# Patient Record
Sex: Male | Born: 1995 | Race: White | Hispanic: No | Marital: Single | Smoking: Never smoker
Health system: Southern US, Community
[De-identification: ages and names within clinical notes are randomized; demographics above are authoritative.]

## PROBLEM LIST (undated history)

## (undated) DIAGNOSIS — S62309A Unspecified fracture of unspecified metacarpal bone, initial encounter for closed fracture: Secondary | ICD-10-CM

## (undated) DIAGNOSIS — J45909 Unspecified asthma, uncomplicated: Secondary | ICD-10-CM

## (undated) DIAGNOSIS — R011 Cardiac murmur, unspecified: Secondary | ICD-10-CM

## (undated) HISTORY — PX: WISDOM TOOTH EXTRACTION: SHX21

---
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
## 2015-07-28 ENCOUNTER — Encounter (HOSPITAL_BASED_OUTPATIENT_CLINIC_OR_DEPARTMENT_OTHER): Payer: Self-pay | Admitting: *Deleted

## 2015-07-28 ENCOUNTER — Emergency Department (HOSPITAL_BASED_OUTPATIENT_CLINIC_OR_DEPARTMENT_OTHER)
Admission: EM | Admit: 2015-07-28 | Discharge: 2015-07-28 | Disposition: A | Discharge status: Home / Self Care | Payer: 59 | Attending: Emergency Medicine | Admitting: Emergency Medicine

## 2015-07-28 DIAGNOSIS — R011 Cardiac murmur, unspecified: Secondary | ICD-10-CM | POA: Diagnosis not present

## 2015-07-28 DIAGNOSIS — J45909 Unspecified asthma, uncomplicated: Secondary | ICD-10-CM | POA: Diagnosis not present

## 2015-07-28 DIAGNOSIS — Z79899 Other long term (current) drug therapy: Secondary | ICD-10-CM | POA: Insufficient documentation

## 2015-07-28 DIAGNOSIS — L600 Ingrowing nail: Secondary | ICD-10-CM | POA: Diagnosis not present

## 2015-07-28 DIAGNOSIS — Z8781 Personal history of (healed) traumatic fracture: Secondary | ICD-10-CM | POA: Diagnosis not present

## 2015-07-28 DIAGNOSIS — Z791 Long term (current) use of non-steroidal anti-inflammatories (NSAID): Secondary | ICD-10-CM | POA: Insufficient documentation

## 2015-07-28 DIAGNOSIS — M79675 Pain in left toe(s): Secondary | ICD-10-CM | POA: Diagnosis present

## 2015-07-28 MED ORDER — LIDOCAINE HCL 2 % IJ SOLN
10.0000 mL | Freq: Once | INTRAMUSCULAR | Status: AC
  Administered 2015-07-28: 10 mg
  Filled 2015-07-28: qty 20

## 2015-07-28 MED ORDER — CEPHALEXIN 500 MG PO CAPS: 500.0000 mg | ORAL_CAPSULE | Freq: Four times a day (QID) | ORAL | Status: DC

## 2015-07-28 NOTE — ED Provider Notes (Signed)
CSN: [id number]     Arrival date & time 07/28/15  0137 History   First MD Initiated Contact with Patient 07/28/15 [phone]     Chief Complaint  Patient presents with  . Toe Pain     (Consider location/radiation/quality/duration/timing/severity/associated sxs/prior Treatment) HPI Comments: Patient is a 20 year old male who presents with complaints of left great toe pain and yesterday. He denies any injury or trauma. He is concerned he has a ingrown toenail.  Patient is a 20 y.o. male presenting with toe pain. The history is provided by the patient.  Toe Pain This is a new problem. The current episode started yesterday. The problem occurs constantly. The problem has been rapidly worsening. The symptoms are aggravated by walking. Nothing relieves the symptoms. He has tried nothing for the symptoms.    Past Medical History  Diagnosis Date  . Fracture of metacarpal 05/2012    right small finger  . Asthma     sports-induced; prn inhaler  . Heart murmur     states is very small, has never had any problems   Past Surgical History  Procedure Laterality Date  . Wisdom tooth extraction    . Open reduction internal fixation (orif) metacarpal  06/06/2012    Procedure: OPEN REDUCTION INTERNAL FIXATION (ORIF) METACARPAL;  Surgeon: Nicki Reaper, MD;  Location: [facility];  Service: Orthopedics;  Laterality: Right;  open reduction internal fixation right 5th finger    History reviewed. No pertinent family history. Social History  Substance Use Topics  . Smoking status: Never Smoker   . Smokeless tobacco: Never Used  . Alcohol Use: No    Review of Systems  All other systems reviewed and are negative.     Allergies  Review of patient's allergies indicates no known allergies.  Home Medications   Prior to Admission medications   Medication Sig Start Date End Date Taking? Authorizing Provider  albuterol (PROVENTIL HFA;VENTOLIN HFA) 108 (90 BASE) MCG/ACT inhaler Inhale 2 puffs  into the lungs every 6 (six) hours as needed.    Historical Provider, MD  HYDROcodone-acetaminophen (NORCO) 5-325 MG per tablet Take 1 tablet by mouth every 6 (six) hours as needed for pain. 06/06/12   Cindee Salt, MD  naproxen sodium (ANAPROX) 220 MG tablet Take 220 mg by mouth 2 (two) times daily with a meal.    Historical Provider, MD   BP 134/79 mmHg  Pulse 84  Temp(Src) 98.7 F (37.1 C) (Oral)  Resp 18  Ht  (1.727 m)  Wt 130 lb (58.968 kg)  BMI 19.77 kg/m2  SpO2 99% Physical Exam  Constitutional: He is oriented to person, place, and time. He appears well-developed and well-nourished. No distress.  HENT:  Head: Normocephalic and atraumatic.  Neck: Normal range of motion. Neck supple.  Neurological: He is alert and oriented to person, place, and time.  Skin: Skin is warm and dry. He is not diaphoretic.  The left great toe has what appears to be an ingrown toenail. There is significant surrounding erythema and swelling. It is tender to the touch.  Nursing note and vitals reviewed.   ED Course  Procedures (including critical care time) Labs Review Labs Reviewed - No data to display  Imaging Review No results found. I have personally reviewed and evaluated these images and lab results as part of my medical decision-making.   EKG Interpretation None      MDM   Final diagnoses:  None    The toenail was anesthetized with  a digital block using 2% lidocaine. The toe was prepped with Betadine and a small section of nail was removed. There was a release of a significant quantity of yellow-green purulent material. He will be treated with Keflex, warm soaks, and when necessary follow-up.    Geoffery Lyons, MD 07/28/15 [phone]

## 2015-07-28 NOTE — Discharge Instructions (Signed)
Soak your toe as frequently as possible for the next 2-3 days.  Keflex as prescribed.  Return to the ER if symptoms significantly worsen or change.   Ingrown Toenail An ingrown toenail occurs when the corner or sides of your toenail grow into the surrounding skin. The big toe is most commonly affected, but it can happen to any of your toes. If your ingrown toenail is not treated, you will be at risk for infection. CAUSES This condition may be caused by:  Wearing shoes that are too small or tight.  Injury or trauma, such as stubbing your toe or having your toe stepped on.  Improper cutting or care of your toenails.  Being born with (congenital) nail or foot abnormalities, such as having a nail that is too big for your toe. RISK FACTORS Risk factors for an ingrown toenail include:  Age. Your nails tend to thicken as you get older, so ingrown nails are more common in older people.  Diabetes.  Cutting your toenails incorrectly.  Blood circulation problems. SYMPTOMS Symptoms may include:  Pain, soreness, or tenderness.  Redness.  Swelling.  Hardening of the skin surrounding the toe. Your ingrown toenail may be infected if there is fluid, pus, or drainage. DIAGNOSIS  An ingrown toenail may be diagnosed by medical history and physical exam. If your toenail is infected, your health care provider may test a sample of the drainage. TREATMENT Treatment depends on the severity of your ingrown toenail. Some ingrown toenails may be treated at home. More severe or infected ingrown toenails may require surgery to remove all or part of the nail. Infected ingrown toenails may also be treated with antibiotic medicines. HOME CARE INSTRUCTIONS  If you were prescribed an antibiotic medicine, finish all of it even if you start to feel better.  Soak your foot in warm soapy water for 20 minutes, 3 times per day or as directed by your health care provider.  Carefully lift the edge of the nail  away from the sore skin by wedging a small piece of cotton under the corner of the nail. This may help with the pain. Be careful not to cause more injury to the area.  Wear shoes that fit well. If your ingrown toenail is causing you pain, try wearing sandals, if possible.  Trim your toenails regularly and carefully. Do not cut them in a curved shape. Cut your toenails straight across. This prevents injury to the skin at the corners of the toenail.  Keep your feet clean and dry.  If you are having trouble walking and are given crutches by your health care provider, use them as directed.  Do not pick at your toenail or try to remove it yourself.  Take medicines only as directed by your health care provider.  Keep all follow-up visits as directed by your health care provider. This is important. SEEK MEDICAL CARE IF:  Your symptoms do not improve with treatment. SEEK IMMEDIATE MEDICAL CARE IF:  You have red streaks that start at your foot and go up your leg.  You have a fever.  You have increased redness, swelling, or pain.  You have fluid, blood, or pus coming from your toenail.   This information is not intended to replace advice given to you by your health care provider. Make sure you discuss any questions you have with your health care provider.   Document Released: 06/08/2000 Document Revised: 10/26/2014 Document Reviewed: 05/05/2014 Elsevier Interactive Patient Education [employer]

## 2015-07-28 NOTE — ED Notes (Signed)
C/o pain to left great toe on set yesterday,,  States saw green drainage,  Toe is red ,  Wears steel toe shoes

## 2018-11-12 ENCOUNTER — Ambulatory Visit (INDEPENDENT_AMBULATORY_CARE_PROVIDER_SITE_OTHER): Payer: No Typology Code available for payment source

## 2018-11-12 ENCOUNTER — Other Ambulatory Visit: Payer: Self-pay

## 2018-11-12 ENCOUNTER — Ambulatory Visit (HOSPITAL_COMMUNITY)
Admission: EM | Admit: 2018-11-12 | Discharge: 2018-11-12 | Disposition: A | Discharge status: Home / Self Care | Payer: No Typology Code available for payment source | Attending: Family Medicine | Admitting: Family Medicine

## 2018-11-12 ENCOUNTER — Encounter (HOSPITAL_COMMUNITY): Payer: Self-pay | Admitting: Emergency Medicine

## 2018-11-12 DIAGNOSIS — S62325A Displaced fracture of shaft of fourth metacarpal bone, left hand, initial encounter for closed fracture: Secondary | ICD-10-CM

## 2018-11-12 NOTE — Discharge Instructions (Signed)
Please rest, ice and elevate the affected extremity. If not allergic, you may take Motrin 600mg every 8 hours or Tylenol 1000mg every 6 hours or as needed for discomfort. Follow up with orthopaedic surgery within one week for further evaluation. Please call for any appointment. Do not remove your splint. You may use a garbage bag while showering to keep your splint dry. Please return here if you are experiencing increased pain, tingling/numbness, swelling, redness, or fever. °

## 2018-11-12 NOTE — ED Provider Notes (Signed)
[facility] CARE CENTER   [id number] 11/12/18 Arrival Time: 1349  ASSESSMENT & PLAN:  1. Closed displaced fracture of shaft of fourth metacarpal bone of left hand, initial encounter    I have personally viewed the imaging studies ordered this visit. Mildly displaced oblique fracture of fourth metacarpal.  Follow-up Information    Schedule an appointment as soon as possible for a visit  with Dominica Severin, MD.   Specialty:  Orthopedic Surgery Contact information: [address] [place] [address] [phone]          Ulnar gutter splint applied by orthopaedic tech. Sling fitted.   Discharge Instructions     Please rest, ice and elevate the affected extremity. If not allergic, you may take Motrin 600mg  every 8 hours or Tylenol 1000mg  every 6 hours or as needed for discomfort. Follow up with orthopaedic surgery within one week for further evaluation. Please call for any appointment. Do not remove your splint. You may use a garbage bag while showering to keep your splint dry. Please return here if you are experiencing increased pain, tingling/numbness, swelling, redness, or fever.      Reviewed expectations re: course of current medical issues. Questions answered. Outlined signs and symptoms indicating need for more acute intervention. Patient verbalized understanding. After Visit Summary given.  SUBJECTIVE: History from: patient. Troy Howell is a left-handed 23 y.o. male who reports persistent moderate pain of his left dorsal hand; described as aching without radiation. Onset: abrupt, today. Injury/trama: yes, reports abruptly hyperextending fingers of left hand while working on car; immediate discomfort. Symptoms have progressed to a point and plateaued since beginning. Aggravating factors: movement. Alleviating factors: holding hand still. Associated symptoms: none reported. Extremity sensation changes or weakness: none. Self treatment: has not  tried OTCs for relief of pain. History of similar: no.  Past Surgical History:  Procedure Laterality Date  . OPEN REDUCTION INTERNAL FIXATION (ORIF) METACARPAL  06/06/2012   Procedure: OPEN REDUCTION INTERNAL FIXATION (ORIF) METACARPAL;  Surgeon: Nicki Reaper, MD;  Location: [facility];  Service: Orthopedics;  Laterality: Right;  open reduction internal fixation right 5th finger   . WISDOM TOOTH EXTRACTION      ROS: As per HPI. All other systems negative.   OBJECTIVE:  Vitals:   11/12/18 1412  BP: (!) 141/85  Pulse: 85  Resp: 18  Temp: 98.8 F (37.1 C)  TempSrc: Oral  SpO2: 97%    General appearance: alert; no distress HEENT: [place]; AT Neck: supple with FROM Extremities: . LUE: warm and well perfused; fairly well localized moderate tenderness over left dorsal hand around mid to distal 2/3 metacarpals; with gross deformities; with mild swelling; with no bruising; finger/wrist ROM: normal with reported discomfort CV: brisk extremity capillary refill of LUE; 2+ radial pulse of LUE. Skin: warm and dry; no visible rashes Neurologic: gait normal; normal reflexes of LUE; normal sensation of LUE; normal strength of LUE Psychological: alert and cooperative; normal mood and affect  Imaging: Dg Hand Complete Left  Result Date: 11/12/2018 CLINICAL DATA:  Left hand pain EXAM: LEFT HAND - COMPLETE 3+ VIEW COMPARISON:  None. FINDINGS: Minimally displaced oblique fracture of the fourth metacarpal. Moderate soft tissue swelling. No dislocation. No other fracture. IMPRESSION: Minimally displaced oblique fracture of the left fourth metacarpal. Electronically Signed   By: Deatra Robinson M.D.   On: 11/12/2018 14:41    No Known Allergies  Past Medical History:  Diagnosis Date  . Asthma    sports-induced; prn  inhaler  . Fracture of metacarpal 05/2012   right small finger  . Heart murmur    states is very small, has never had any problems   Social History   Socioeconomic  History  . Marital status: Single    Spouse name: Not on file  . Number of children: Not on file  . Years of education: Not on file  . Highest education level: Not on file  Occupational History  . Not on file  Social Needs  . Financial resource strain: Not on file  . Food insecurity:    Worry: Not on file    Inability: Not on file  . Transportation needs:    Medical: Not on file    Non-medical: Not on file  Tobacco Use  . Smoking status: Never Smoker  . Smokeless tobacco: Never Used  Substance and Sexual Activity  . Alcohol use: No  . Drug use: No  . Sexual activity: Not on file  Lifestyle  . Physical activity:    Days per week: Not on file    Minutes per session: Not on file  . Stress: Not on file  Relationships  . Social connections:    Talks on phone: Not on file    Gets together: Not on file    Attends religious service: Not on file    Active member of club or organization: Not on file    Attends meetings of clubs or organizations: Not on file    Relationship status: Not on file  Other Topics Concern  . Not on file  Social History Narrative  . Not on file   Family History  Problem Relation Age of Onset  . Healthy Mother    Past Surgical History:  Procedure Laterality Date  . OPEN REDUCTION INTERNAL FIXATION (ORIF) METACARPAL  06/06/2012   Procedure: OPEN REDUCTION INTERNAL FIXATION (ORIF) METACARPAL;  Surgeon: Nicki ReaperGary R Kuzma, MD;  Location: [facility];  Service: Orthopedics;  Laterality: Right;  open reduction internal fixation right 5th finger   . WISDOM TOOTH EXTRACTION        Mardella LaymanHagler, Giana Castner, MD 11/12/18 [phone]

## 2018-11-12 NOTE — ED Triage Notes (Signed)
Pt sts left hand pain after injuring while working on a car

## 2018-11-12 NOTE — Progress Notes (Signed)
Orthopedic Tech Progress Note Patient Details:  Troy Howell 10-24-1995 [id number] The RN asked me to bring a sling from [facility]ICE because they did not have any Large slings there. Ortho Devices Type of Ortho Device: Arm sling, Ulna gutter splint Ortho Device/Splint Location: ULE Ortho Device/Splint Interventions: Adjustment, Application, Ordered   Post Interventions Patient Tolerated: Well Instructions Provided: Care of device, Adjustment of device   [name] 11/12/2018, 3:32 PM

## 2018-11-19 ENCOUNTER — Encounter (HOSPITAL_BASED_OUTPATIENT_CLINIC_OR_DEPARTMENT_OTHER): Payer: Self-pay | Admitting: *Deleted

## 2018-11-19 ENCOUNTER — Other Ambulatory Visit: Payer: Self-pay | Admitting: Orthopedic Surgery

## 2018-11-19 ENCOUNTER — Other Ambulatory Visit (HOSPITAL_COMMUNITY)
Admission: RE | Admit: 2018-11-19 | Discharge: 2018-11-19 | Disposition: A | Discharge status: Home / Self Care | Payer: No Typology Code available for payment source | Source: Ambulatory Visit | Attending: Orthopedic Surgery | Admitting: Orthopedic Surgery

## 2018-11-19 ENCOUNTER — Other Ambulatory Visit: Payer: Self-pay

## 2018-11-19 DIAGNOSIS — Z1159 Encounter for screening for other viral diseases: Secondary | ICD-10-CM | POA: Insufficient documentation

## 2018-11-19 LAB — SARS CORONAVIRUS 2 BY RT PCR (HOSPITAL ORDER, PERFORMED IN ~~LOC~~ HOSPITAL LAB): SARS Coronavirus 2: NEGATIVE

## 2018-11-20 ENCOUNTER — Encounter (HOSPITAL_BASED_OUTPATIENT_CLINIC_OR_DEPARTMENT_OTHER): Payer: Self-pay | Admitting: *Deleted

## 2018-11-20 ENCOUNTER — Other Ambulatory Visit: Payer: Self-pay

## 2018-11-20 ENCOUNTER — Encounter (HOSPITAL_BASED_OUTPATIENT_CLINIC_OR_DEPARTMENT_OTHER)
Admission: RE | Disposition: A | Discharge status: Home / Self Care | Payer: Self-pay | Source: Home / Self Care | Attending: Orthopedic Surgery

## 2018-11-20 ENCOUNTER — Ambulatory Visit (HOSPITAL_BASED_OUTPATIENT_CLINIC_OR_DEPARTMENT_OTHER): Payer: No Typology Code available for payment source | Admitting: Anesthesiology

## 2018-11-20 ENCOUNTER — Ambulatory Visit (HOSPITAL_BASED_OUTPATIENT_CLINIC_OR_DEPARTMENT_OTHER)
Admission: RE | Admit: 2018-11-20 | Discharge: 2018-11-20 | Disposition: A | Discharge status: Home / Self Care | Payer: No Typology Code available for payment source | Attending: Orthopedic Surgery | Admitting: Orthopedic Surgery

## 2018-11-20 DIAGNOSIS — X58XXXA Exposure to other specified factors, initial encounter: Secondary | ICD-10-CM | POA: Diagnosis not present

## 2018-11-20 DIAGNOSIS — Z79899 Other long term (current) drug therapy: Secondary | ICD-10-CM | POA: Insufficient documentation

## 2018-11-20 DIAGNOSIS — Z7951 Long term (current) use of inhaled steroids: Secondary | ICD-10-CM | POA: Diagnosis not present

## 2018-11-20 DIAGNOSIS — S62325A Displaced fracture of shaft of fourth metacarpal bone, left hand, initial encounter for closed fracture: Secondary | ICD-10-CM | POA: Diagnosis not present

## 2018-11-20 DIAGNOSIS — J45909 Unspecified asthma, uncomplicated: Secondary | ICD-10-CM | POA: Diagnosis not present

## 2018-11-20 HISTORY — PX: OPEN REDUCTION INTERNAL FIXATION (ORIF) METACARPAL: SHX6234

## 2018-11-20 SURGERY — OPEN REDUCTION INTERNAL FIXATION (ORIF) METACARPAL
Anesthesia: Monitor Anesthesia Care | Laterality: Left

## 2018-11-20 MED ORDER — PROPOFOL 500 MG/50ML IV EMUL
INTRAVENOUS | Status: DC | PRN
  Administered 2018-11-20: 125 ug/kg/min via INTRAVENOUS

## 2018-11-20 MED ORDER — CEFAZOLIN SODIUM-DEXTROSE 2-4 GM/100ML-% IV SOLN
INTRAVENOUS | Status: AC
  Filled 2018-11-20: qty 100

## 2018-11-20 MED ORDER — MIDAZOLAM HCL 2 MG/2ML IJ SOLN
INTRAMUSCULAR | Status: AC
  Filled 2018-11-20: qty 2

## 2018-11-20 MED ORDER — ONDANSETRON HCL 4 MG/2ML IJ SOLN
INTRAMUSCULAR | Status: AC
  Filled 2018-11-20: qty 2

## 2018-11-20 MED ORDER — CHLORHEXIDINE GLUCONATE 4 % EX LIQD: 60.0000 mL | Freq: Once | CUTANEOUS | Status: DC

## 2018-11-20 MED ORDER — ROPIVACAINE HCL 5 MG/ML IJ SOLN
INTRAMUSCULAR | Status: DC | PRN
  Administered 2018-11-20: 30 mL via PERINEURAL

## 2018-11-20 MED ORDER — PROMETHAZINE HCL 25 MG/ML IJ SOLN: 6.2500 mg | INTRAMUSCULAR | Status: DC | PRN

## 2018-11-20 MED ORDER — LIDOCAINE 2% (20 MG/ML) 5 ML SYRINGE
INTRAMUSCULAR | Status: AC
  Filled 2018-11-20: qty 15

## 2018-11-20 MED ORDER — OXYCODONE HCL 5 MG/5ML PO SOLN: 5.0000 mg | Freq: Once | ORAL | Status: DC | PRN

## 2018-11-20 MED ORDER — LACTATED RINGERS IV SOLN
INTRAVENOUS | Status: DC
  Administered 2018-11-20 (×2): via INTRAVENOUS

## 2018-11-20 MED ORDER — CEFAZOLIN SODIUM-DEXTROSE 2-4 GM/100ML-% IV SOLN
2.0000 g | INTRAVENOUS | Status: AC
  Administered 2018-11-20: 2 g via INTRAVENOUS

## 2018-11-20 MED ORDER — OXYCODONE HCL 5 MG PO TABS: 5.0000 mg | ORAL_TABLET | Freq: Once | ORAL | Status: DC | PRN

## 2018-11-20 MED ORDER — FENTANYL CITRATE (PF) 100 MCG/2ML IJ SOLN
50.0000 ug | INTRAMUSCULAR | Status: DC | PRN
  Administered 2018-11-20: 100 ug via INTRAVENOUS

## 2018-11-20 MED ORDER — MIDAZOLAM HCL 2 MG/2ML IJ SOLN
1.0000 mg | INTRAMUSCULAR | Status: DC | PRN
  Administered 2018-11-20 (×2): 2 mg via INTRAVENOUS

## 2018-11-20 MED ORDER — HYDROMORPHONE HCL 1 MG/ML IJ SOLN: 0.2500 mg | INTRAMUSCULAR | Status: DC | PRN

## 2018-11-20 MED ORDER — KETOROLAC TROMETHAMINE 30 MG/ML IJ SOLN
INTRAMUSCULAR | Status: AC
  Filled 2018-11-20: qty 1

## 2018-11-20 MED ORDER — HYDROCODONE-ACETAMINOPHEN 5-325 MG PO TABS: 1.0000 | ORAL_TABLET | Freq: Four times a day (QID) | ORAL | 0 refills | Status: DC | PRN

## 2018-11-20 MED ORDER — SCOPOLAMINE 1 MG/3DAYS TD PT72: 1.0000 | MEDICATED_PATCH | Freq: Once | TRANSDERMAL | Status: DC | PRN

## 2018-11-20 MED ORDER — LIDOCAINE 2% (20 MG/ML) 5 ML SYRINGE
INTRAMUSCULAR | Status: DC | PRN
  Administered 2018-11-20: 50 mg via INTRAVENOUS

## 2018-11-20 MED ORDER — FENTANYL CITRATE (PF) 100 MCG/2ML IJ SOLN
INTRAMUSCULAR | Status: AC
  Filled 2018-11-20: qty 2

## 2018-11-20 SURGICAL SUPPLY — 51 items
APL PRP STRL LF DISP 70% ISPRP (MISCELLANEOUS) ×1
BIT DRILL 1.0 (BIT) ×2
BIT DRILL 1.0MM (BIT) ×1
BIT DRILL 1.0X50 (BIT) IMPLANT
BLADE MINI RND TIP GREEN BEAV (BLADE) ×3 IMPLANT
BLADE SURG 15 STRL LF DISP TIS (BLADE) ×1 IMPLANT
BLADE SURG 15 STRL SS (BLADE) ×3
BNDG CMPR 9X4 STRL LF SNTH (GAUZE/BANDAGES/DRESSINGS) ×1
BNDG COHESIVE 3X5 TAN STRL LF (GAUZE/BANDAGES/DRESSINGS) ×1 IMPLANT
BNDG ESMARK 4X9 LF (GAUZE/BANDAGES/DRESSINGS) ×3 IMPLANT
BNDG GAUZE ELAST 4 BULKY (GAUZE/BANDAGES/DRESSINGS) ×3 IMPLANT
CHLORAPREP W/TINT 26 (MISCELLANEOUS) ×3 IMPLANT
CORD BIPOLAR FORCEPS 12FT (ELECTRODE) ×3 IMPLANT
COVER BACK TABLE REUSABLE LG (DRAPES) ×3 IMPLANT
COVER MAYO STAND REUSABLE (DRAPES) ×3 IMPLANT
COVER WAND RF STERILE (DRAPES) IMPLANT
CUFF TOURN SGL QUICK 18X4 (TOURNIQUET CUFF) ×2 IMPLANT
DECANTER SPIKE VIAL GLASS SM (MISCELLANEOUS) IMPLANT
DRAPE EXTREMITY T 121X128X90 (DISPOSABLE) ×3 IMPLANT
DRAPE OEC MINIVIEW 54X84 (DRAPES) ×3 IMPLANT
DRAPE SURG 17X23 STRL (DRAPES) ×3 IMPLANT
GAUZE SPONGE 4X4 12PLY STRL (GAUZE/BANDAGES/DRESSINGS) ×3 IMPLANT
GAUZE XEROFORM 1X8 LF (GAUZE/BANDAGES/DRESSINGS) ×3 IMPLANT
GLOVE BIOGEL PI IND STRL 8.5 (GLOVE) ×1 IMPLANT
GLOVE BIOGEL PI INDICATOR 8.5 (GLOVE) ×2
GLOVE SURG ORTHO 8.0 STRL STRW (GLOVE) ×3 IMPLANT
GOWN STRL REUS W/ TWL LRG LVL3 (GOWN DISPOSABLE) IMPLANT
GOWN STRL REUS W/TWL LRG LVL3 (GOWN DISPOSABLE) ×3
GOWN STRL REUS W/TWL XL LVL3 (GOWN DISPOSABLE) ×6 IMPLANT
NDL PRECISIONGLIDE 27X1.5 (NEEDLE) IMPLANT
NEEDLE PRECISIONGLIDE 27X1.5 (NEEDLE) IMPLANT
NS IRRIG 1000ML POUR BTL (IV SOLUTION) ×3 IMPLANT
PACK BASIN DAY SURGERY FS (CUSTOM PROCEDURE TRAY) ×3 IMPLANT
PAD CAST 3X4 CTTN HI CHSV (CAST SUPPLIES) ×1 IMPLANT
PADDING CAST ABS 4INX4YD NS (CAST SUPPLIES) ×2
PADDING CAST ABS COTTON 4X4 ST (CAST SUPPLIES) ×1 IMPLANT
PADDING CAST COTTON 3X4 STRL (CAST SUPPLIES) ×3
SCREW SELF TAP CORTEX 1.3 7MM (Screw) ×4 IMPLANT
SCREW SELF TAP CORTEX 1.3 8MM (Screw) ×3 IMPLANT
SLEEVE SCD COMPRESS KNEE MED (MISCELLANEOUS) ×3 IMPLANT
SPLINT PLASTER CAST XFAST 3X15 (CAST SUPPLIES) IMPLANT
SPLINT PLASTER XTRA FASTSET 3X (CAST SUPPLIES) ×2
STOCKINETTE 4X48 STRL (DRAPES) ×3 IMPLANT
SUT CHROMIC 5 0 P 3 (SUTURE) ×2 IMPLANT
SUT ETHILON 4 0 PS 2 18 (SUTURE) ×3 IMPLANT
SUT MERSILENE 4 0 P 3 (SUTURE) IMPLANT
SUT VICRYL 4-0 PS2 18IN ABS (SUTURE) ×3 IMPLANT
SYR BULB 3OZ (MISCELLANEOUS) ×3 IMPLANT
SYR CONTROL 10ML LL (SYRINGE) IMPLANT
TOWEL GREEN STERILE FF (TOWEL DISPOSABLE) ×5 IMPLANT
UNDERPAD 30X30 (UNDERPADS AND DIAPERS) ×3 IMPLANT

## 2018-11-20 NOTE — Brief Op Note (Signed)
11/20/2018  2:04 PM  PATIENT:  Troy Howell  23 y.o. male  PRE-OPERATIVE DIAGNOSIS:  FRACTURE LEFT RING METACARPAL  POST-OPERATIVE DIAGNOSIS:  FRACTURE LEFT RING METACARPAL  PROCEDURE:  Procedure(s) with comments: OPEN REDUCTION INTERNAL FIXATION (ORIF)LEFT RING  METACARPAL (Left) - AUXILLARY  SURGEON:  Surgeon(s) and Role:    * Troy Salt, MD - Primary    * Troy Loa, MD - Assisting  PHYSICIAN ASSISTANT:   ASSISTANTS: Troy Layton Tappan,MD   ANESTHESIA:   regional and IV sedation  EBL: 62ml BLOOD ADMINISTERED:none  DRAINS: none   LOCAL MEDICATIONS USED:  NONE  SPECIMEN:  No Specimen  DISPOSITION OF SPECIMEN:  N/A  COUNTS:  YES  TOURNIQUET:   Total Tourniquet Time Documented: Upper Arm (Left) - 44 minutes Total: Upper Arm (Left) - 44 minutes   DICTATION: .Troy Howell Dictation  PLAN OF CARE: Discharge to home after PACU  PATIENT DISPOSITION:  PACU - hemodynamically stable.

## 2018-11-20 NOTE — Op Note (Signed)
NAME: Troy Howell MEDICAL RECORD NO: [id number] DATE OF BIRTH: 11/08/1995 [facility] LOCATION: [facility] PHYSICIAN: Nicki ReaperGARY R. Imelda Dandridge, MD   OPERATIVE REPORT   DATE OF PROCEDURE: 11/20/18    PREOPERATIVE DIAGNOSIS: Fracture ring finger metacarpal left hand   POSTOPERATIVE DIAGNOSIS:   Same   PROCEDURE:   Open reduction internal fixation metacarpal ring finger left hand   SURGEON: Cindee SaltGary Clemie General, M.D.   ASSISTANT: Betha LoaKevin Tierria Watson, MD   ANESTHESIA:  Regional with sedation   INTRAVENOUS FLUIDS:  Per anesthesia flow sheet.   ESTIMATED BLOOD LOSS:  Minimal.   COMPLICATIONS:  None.   SPECIMENS:  none   TOURNIQUET TIME:    Total Tourniquet Time Documented: Upper Arm (Left) - 44 minutes Total: Upper Arm (Left) - 44 minutes    DISPOSITION:  Stable to PACU.   INDICATIONS: Patient is a 23 year old male who sustained an injury to his left hand while working on his car he had x-rays taken of 1 week ago revealing a long oblique fracture of his ring finger metacarpal repeat x-rays revealed displacement.  He is admitted for open reduction internal fixation fourth metacarpal right hand.  Pre-peri-and postoperative course been discussed along with risks and complications.  He is aware there is no guarantee to the surgery the possibility of infection recurrence injury to arteries nerves tendons incomplete relief of symptoms and dystrophy.  He has undergone open reduction internal fixation of a similar fracture on his opposite opposite hand in the past.  In the preoperative area the patient seen the extremity marked by both patient and surgeon antibiotic given  OPERATIVE COURSE: Patient is brought to the operating room after supraclavicular block was carried out without difficulty in the preoperative area under the direction of the anesthesia department.  He was prepped using ChloraPrep in the supine position with the left arm free.  A timeout was taken to confirm patient  procedure.  A longitudinal incision was made on the dorsum of his hand and a an interval between his tattoo.  This carried down through subcutaneous tissue.  Bleeders were electrocauterized with bipolar.  Dorsal sensory ulnar nerve branches were identified protected.  Dissection was carried down to the radial aspect of the extensor to the ring finger.  The periosteum was incised.  Moderate musculature was present over the proximal aspect of the metacarpal of the.  The fracture fragment was identified this was opened and debrided with a house curette.  The fracture was then reduced clamped with a phalangeal clamp the x-rays taken AP lateral and obliques reveal the fracture and good position with flexion extension of his finger there was no rotatory deformity.  The 1.3 millimeters screws from the modular hand were used for fixation.  These were drilled with a 1.0 mm drill bit countersunk measured 8 7 and 7 mm screws were then inserted into the bone fixing the fracture in good position.  This was confirmed with AP lateral x-rays with flexion extension no rotation of the finger was noted.  Wound was copiously irrigated with saline.  The periosteum muscle was closed with a running 4-0 chromic suture.  The subcutaneous tissue was closed with interrupted 4-0.  The skin was closed realigning his tattoo with interrupted 4-0 nylon sutures.  A sterile compressive dressing dorsal palmar splint was applied.  Deflation of the tourniquet all fingers immediately pink.  He was taken to the recovery room for observation in satisfactory condition.  He will be discharged home to return the hand  center of [facility] in 1 week Tylenol ibuprofen for pain with Norco as a backup.   Cindee Salt, MD Electronically signed, 11/20/18

## 2018-11-20 NOTE — H&P (Signed)
Troy Howell is an 22 y.o. male.   Chief Complaint: fracture left ring fingerHPI: Troy Howell is a 23 year old left-hand-dominant male former patient. Is not been seen in approximately 6 years. He comes in with a new problem. He sustained an injury to his left hand when he was working on a car changing a tire when the jack gave way and the cat dark card came down on his hand. This occurred on 11/12/2018 he went to urgent care where x-rays were taken revealing a fracture of his fourth metacarpal. A long oblique fracture. Placed in a splint referred. The has no injury to the skin. He has moderate pain sharp in nature a VAS score of 5-6 over 10 he has been taking Tylenol for this. He has been in a splint. He states that he splint is holding his hand in a slightly flexed position is somewhat uncomfortable. He has no history of diabetes thyroid problems arthritis or gout. Family history is negative for these.    Past Medical History:  Diagnosis Date  . Asthma    sports-induced; prn inhaler  . Fracture of metacarpal 05/2012   right small finger  . Heart murmur    states is very small, has never had any problems    Past Surgical History:  Procedure Laterality Date  . OPEN REDUCTION INTERNAL FIXATION (ORIF) METACARPAL  06/06/2012   Procedure: OPEN REDUCTION INTERNAL FIXATION (ORIF) METACARPAL;  Surgeon: Nicki Reaper, MD;  Location: [facility];  Service: Orthopedics;  Laterality: Right;  open reduction internal fixation right 5th finger   . WISDOM TOOTH EXTRACTION      Family History  Problem Relation Age of Onset  . Healthy Mother    Social History:  reports that he has never smoked. He has never used smokeless tobacco. He reports that he does not drink alcohol or use drugs.  Allergies:  Allergies  Allergen Reactions  . Omeprazole Hives    Medications Prior to Admission  Medication Sig Dispense Refill  . albuterol (PROVENTIL HFA;VENTOLIN HFA) 108 (90 BASE) MCG/ACT inhaler Inhale 2  puffs into the lungs every 6 (six) hours as needed.      Results for orders placed or performed during the hospital encounter of 11/19/18 (from the past 48 hour(s))  SARS Coronavirus 2 (CEPHEID - Performed in [facility] hospital lab), Hosp Order     Status: None   Collection Time: 11/19/18 10:06 AM  Result Value Ref Range   SARS Coronavirus 2 NEGATIVE NEGATIVE    Comment: (NOTE) If result is NEGATIVE SARS-CoV-2 target nucleic acids are NOT DETECTED. The SARS-CoV-2 RNA is generally detectable in upper and lower  respiratory specimens during the acute phase of infection. The lowest  concentration of SARS-CoV-2 viral copies this assay can detect is 250  copies / mL. A negative result does not preclude SARS-CoV-2 infection  and should not be used as the sole basis for treatment or other  patient management decisions.  A negative result may occur with  improper specimen collection / handling, submission of specimen other  than nasopharyngeal swab, presence of viral mutation(s) within the  areas targeted by this assay, and inadequate number of viral copies  (<250 copies / mL). A negative result must be combined with clinical  observations, patient history, and epidemiological information. If result is POSITIVE SARS-CoV-2 target nucleic acids are DETECTED. The SARS-CoV-2 RNA is generally detectable in upper and lower  respiratory specimens dur ing the acute phase of infection.  Positive  results are indicative of active infection with SARS-CoV-2.  Clinical  correlation with patient history and other diagnostic information is  necessary to determine patient infection status.  Positive results do  not rule out bacterial infection or co-infection with other viruses. If result is PRESUMPTIVE POSTIVE SARS-CoV-2 nucleic acids MAY BE PRESENT.   A presumptive positive result was obtained on the submitted specimen  and confirmed on repeat testing.  While 2019 novel coronavirus  (SARS-CoV-2)  nucleic acids may be present in the submitted sample  additional confirmatory testing may be necessary for epidemiological  and / or clinical management purposes  to differentiate between  SARS-CoV-2 and other Sarbecovirus currently known to infect humans.  If clinically indicated additional testing with an alternate test  methodology [phone]([id number]) is advised. The SARS-CoV-2 RNA is generally  detectable in upper and lower respiratory sp ecimens during the acute  phase of infection. The expected result is Negative. Fact Sheet for Patients:  BoilerBrush.com.cyhttps://www.fda.gov/media/136312/download Fact Sheet for Healthcare Providers: https://pope.com/https://www.fda.gov/media/136313/download This test is not yet approved or cleared by the [place] FDA and has been authorized for detection and/or diagnosis of SARS-CoV-2 by FDA under an Emergency Use Authorization (EUA).  This EUA will remain in effect (meaning this test can be used) for the duration of the COVID-19 declaration under Section 564(b)(1) of the Act, 21 U.S.C. section 360bbb-3(b)(1), unless the authorization is terminated or revoked sooner. Performed at [facility], [address] [place], [place] [id number]     No results found.   Pertinent items are noted in HPI.  Height 5\' 9"  (1.753 m), weight 68 kg.  General appearance: alert, cooperative and appears stated age Head: Normocephalic, without obvious abnormality Neck: no JVD Resp: clear to auscultation bilaterally Cardio: regular rate and rhythm, S1, S2 normal, no murmur, click, rub or gallop GI: soft, non-tender; bowel sounds normal; no masses,  no organomegaly Extremities: fracture left ring finger Pulses: 2+ and symmetric Skin: Skin color, texture, turgor normal. No rashes or lesions Neurologic: Grossly normal Incision/Wound: na  Assessment/Plan Assessment:  1. Closed displaced fracture of shaft of fourth metacarpal bone of left hand   Plan: Has undergone open  reduction internal fixation on his right hand in the past. Would recommend open reduction internal fixation of his left hand at this time. This can be scheduled as an outpatient under regional anesthesia. Pre-peri-and postoperative course are well known to him. He is scheduled for open reduction internal fixation left fourth metacarpal as an outpatient under regional anesthesia.   Cindee SaltGary Jalayne Ganesh 11/20/2018, 10:09 AM

## 2018-11-20 NOTE — Anesthesia Postprocedure Evaluation (Signed)
Anesthesia Post Note  Patient: Troy Howell  Procedure(s) Performed: OPEN REDUCTION INTERNAL FIXATION (ORIF)LEFT RING  METACARPAL (Left )     Patient location during evaluation: PACU Anesthesia Type: Regional and MAC Level of consciousness: awake and alert Pain management: pain level controlled Vital Signs Assessment: post-procedure vital signs reviewed and stable Respiratory status: spontaneous breathing, nonlabored ventilation and respiratory function stable Cardiovascular status: stable and blood pressure returned to baseline Postop Assessment: no apparent nausea or vomiting Anesthetic complications: no    Last Vitals:  Vitals:   11/20/18 1415 11/20/18 1430  BP: 116/79 114/74  Pulse: 62 60  Resp: 13 19  Temp:    SpO2: 97% 98%    Last Pain:  Vitals:   11/20/18 1430  TempSrc:   PainSc: 0-No pain                 Lynda Rainwater

## 2018-11-20 NOTE — Op Note (Signed)
I assisted Surgeon(s) and Role:    * Cindee Salt, MD - Primary    Betha Loa, MD - Assisting on the Procedure(s): OPEN REDUCTION INTERNAL FIXATION (ORIF)LEFT RING  METACARPAL on 11/20/2018.  I provided assistance on this case as follows: retraction soft tissues, cleaning of fracture site, reduction fracture.  Electronically signed by: Betha Loa, MD Date: 11/20/2018 Time: 2:02 PM

## 2018-11-20 NOTE — Anesthesia Procedure Notes (Signed)
Anesthesia Regional Block: Supraclavicular block   Pre-Anesthetic Checklist: ,, timeout performed, Correct Patient, Correct Site, Correct Laterality, Correct Procedure, Correct Position, site marked, Risks and benefits discussed,  Surgical consent,  Pre-op evaluation,  At surgeon's request and post-op pain management  Laterality: Left  Prep: chloraprep       Needles:  Injection technique: Single-shot  Needle Type: Stimiplex     Needle Length: 9cm  Needle Gauge: 21     Additional Needles:   Procedures:,,,, ultrasound used (permanent image in chart),,,,  Narrative:  Start time: 11/20/2018 12:04 PM End time: 11/20/2018 12:09 PM Injection made incrementally with aspirations every 5 mL.  Performed by: Personally  Anesthesiologist: Lowella Curb, MD

## 2018-11-20 NOTE — Transfer of Care (Signed)
Immediate Anesthesia Transfer of Care Note  Patient: Troy Howell  Procedure(s) Performed: OPEN REDUCTION INTERNAL FIXATION (ORIF)LEFT RING  METACARPAL (Left )  Patient Location: PACU  Anesthesia Type:MAC and Regional  Level of Consciousness: awake and alert   Airway & Oxygen Therapy: Patient Spontanous Breathing and Patient connected to nasal cannula oxygen  Post-op Assessment: Report given to RN and Post -op Vital signs reviewed and stable  Post vital signs: Reviewed and stable  Last Vitals:  Vitals Value Taken Time  BP    Temp    Pulse 62 11/20/2018  2:03 PM  Resp 18 11/20/2018  2:03 PM  SpO2 98 % 11/20/2018  2:03 PM  Vitals shown include unvalidated device data.  Last Pain:  Vitals:   11/20/18 1032  TempSrc: Oral  PainSc: 1       Patients Stated Pain Goal: 1 (67/28/97 9150)  Complications: No apparent anesthesia complications

## 2018-11-20 NOTE — Discharge Instructions (Signed)

## 2018-11-20 NOTE — Progress Notes (Signed)
Assisted Dr. Miller with left, ultrasound guided, supraclavicular block. Side rails up, monitors on throughout procedure. See vital signs in flow sheet. Tolerated Procedure well. 

## 2018-11-20 NOTE — Anesthesia Preprocedure Evaluation (Signed)
Anesthesia Evaluation  Patient identified by MRN, date of birth, ID band Patient awake    Reviewed: Allergy & Precautions, H&P , NPO status , Patient's Chart, lab work & pertinent test results  Airway Mallampati: I  TM Distance: >3 FB Neck ROM: Full    Dental   Pulmonary asthma ,    breath sounds clear to auscultation       Cardiovascular  Rhythm:Regular Rate:Normal     Neuro/Psych    GI/Hepatic   Endo/Other    Renal/GU      Musculoskeletal   Abdominal   Peds  Hematology   Anesthesia Other Findings   Reproductive/Obstetrics                             Anesthesia Physical  Anesthesia Plan  ASA: II  Anesthesia Plan: MAC and Regional   Post-op Pain Management:  Regional for Post-op pain   Induction: Intravenous  PONV Risk Score and Plan: 1 and Ondansetron  Airway Management Planned: Simple Face Mask  Additional Equipment:   Intra-op Plan:   Post-operative Plan:   Informed Consent: I have reviewed the patients History and Physical, chart, labs and discussed the procedure including the risks, benefits and alternatives for the proposed anesthesia with the patient or authorized representative who has indicated his/her understanding and acceptance.     Dental advisory given  Plan Discussed with: CRNA and Surgeon  Anesthesia Plan Comments:         Anesthesia Quick Evaluation

## 2018-11-21 ENCOUNTER — Encounter (HOSPITAL_BASED_OUTPATIENT_CLINIC_OR_DEPARTMENT_OTHER): Payer: Self-pay | Admitting: Orthopedic Surgery

## 2018-11-21 NOTE — Op Note (Signed)
Radiology Note Flouroscopic images ap,lat oblique images Reduction and fixation 4th metacarpal shaft fracture with screws in anatomic alignment

## 2020-07-16 IMAGING — DX LEFT HAND - COMPLETE 3+ VIEW
3 series · 3 of 3 positions shown · non-contrast
Comparison: None.

CLINICAL DATA: Left hand pain

EXAM:
LEFT HAND - COMPLETE 3+ VIEW

[hand pa]
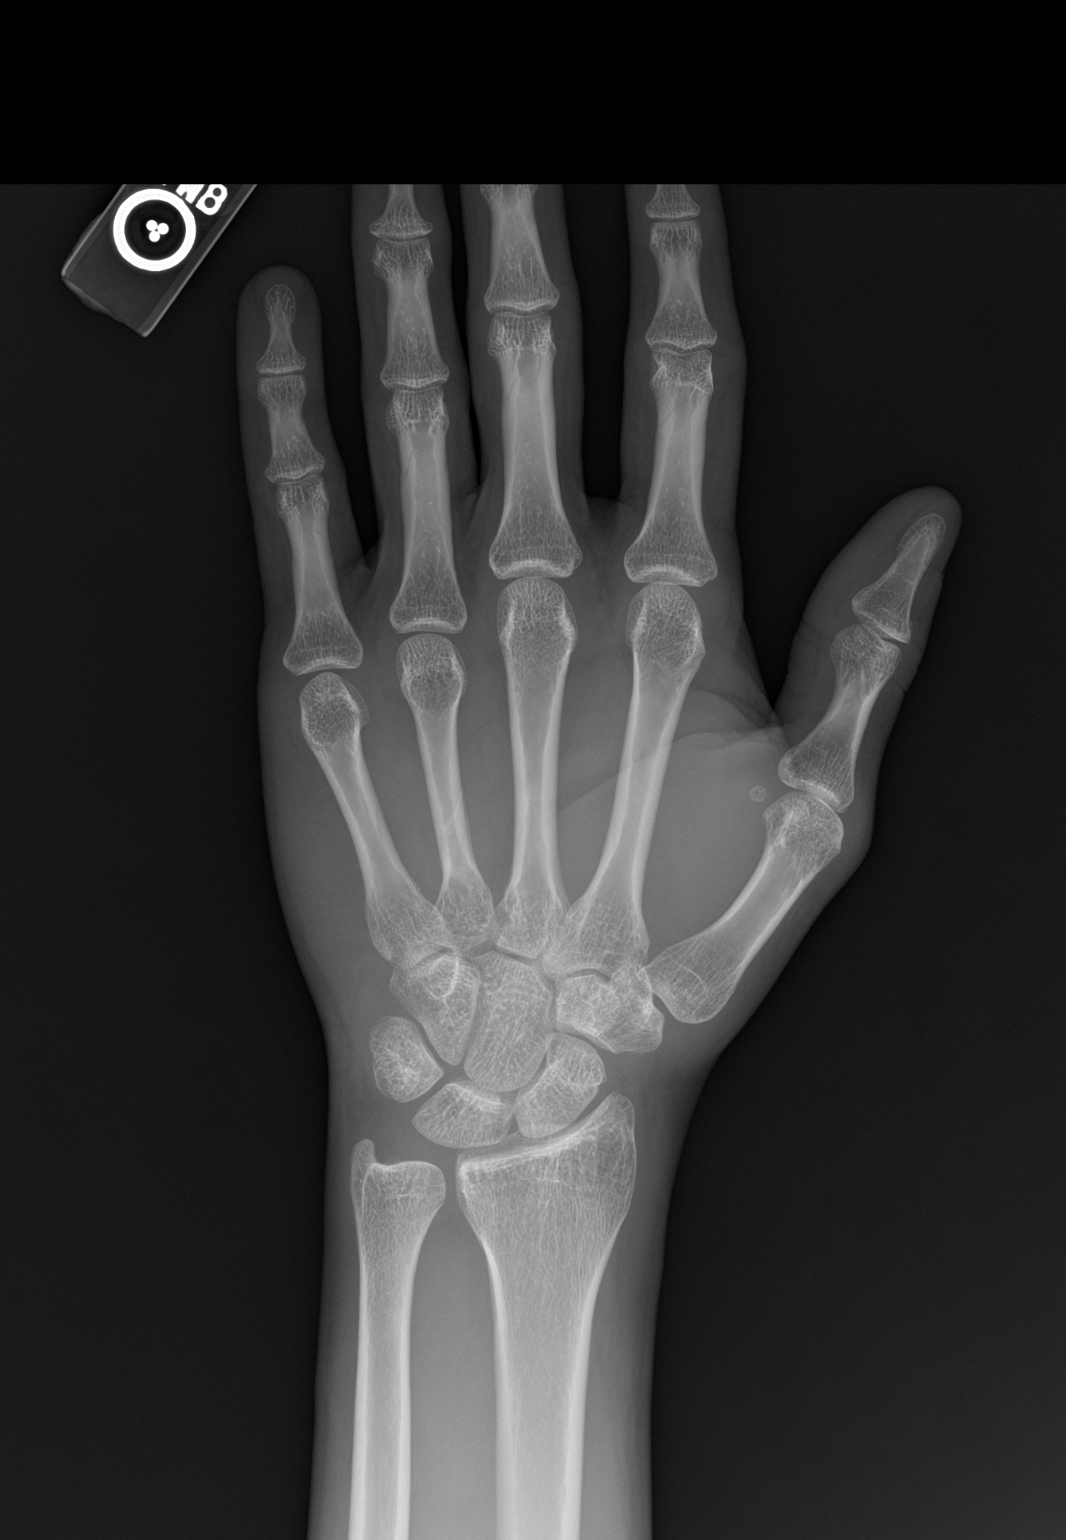

[hand obl]
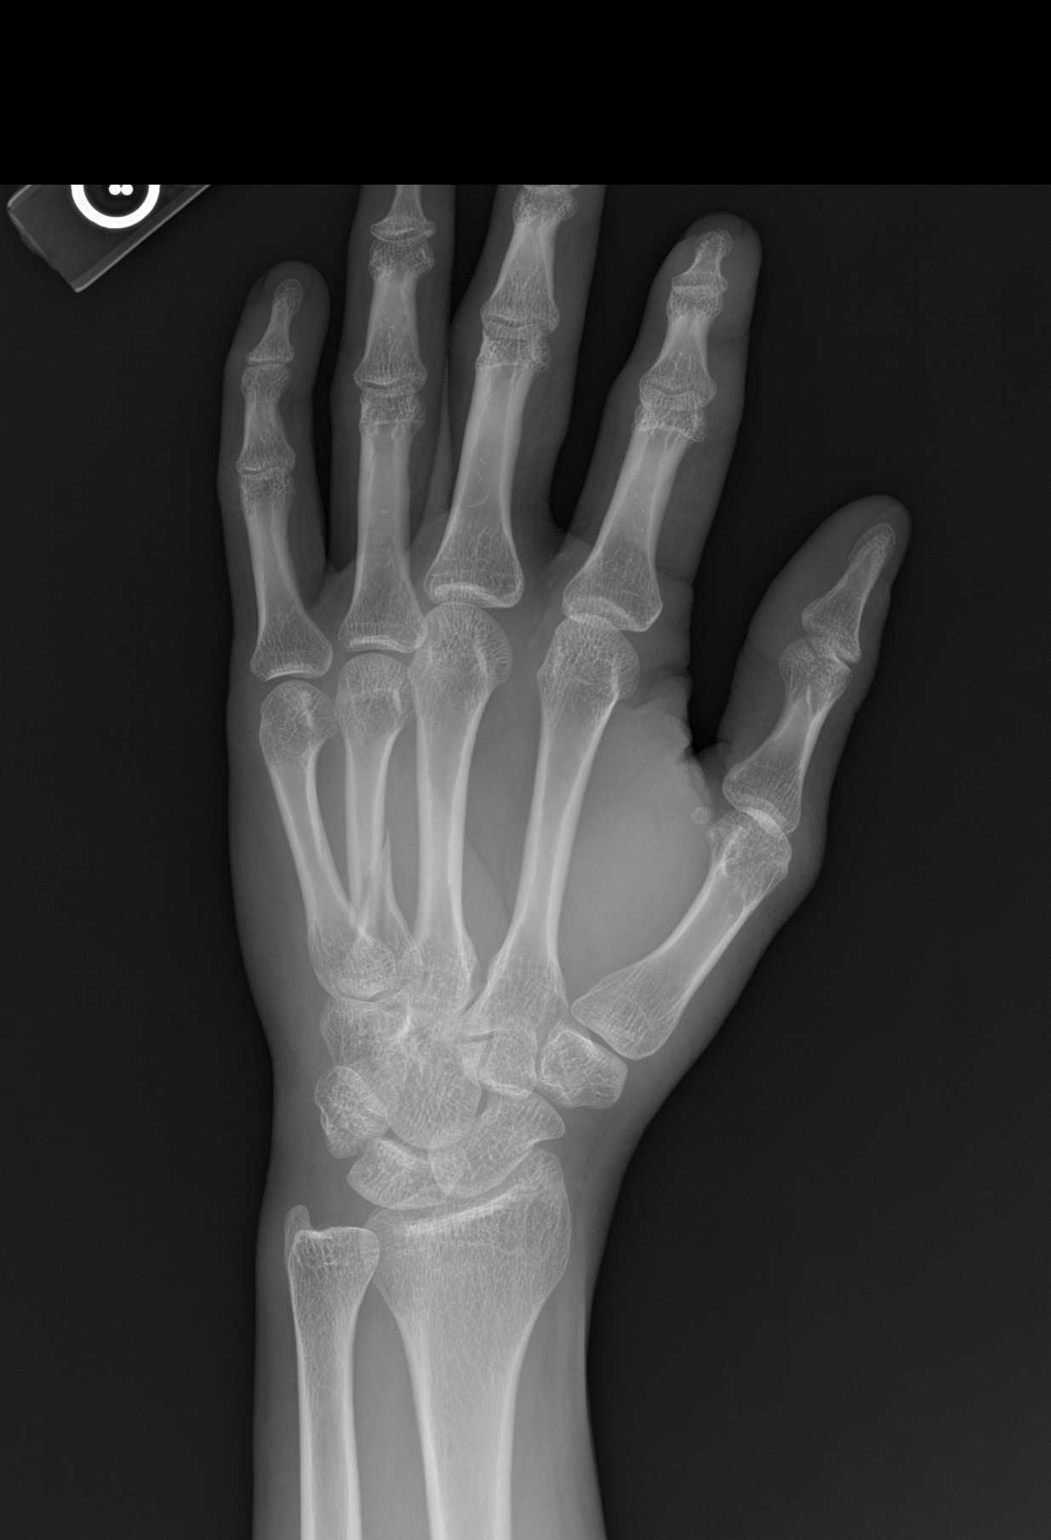

[hand lat]
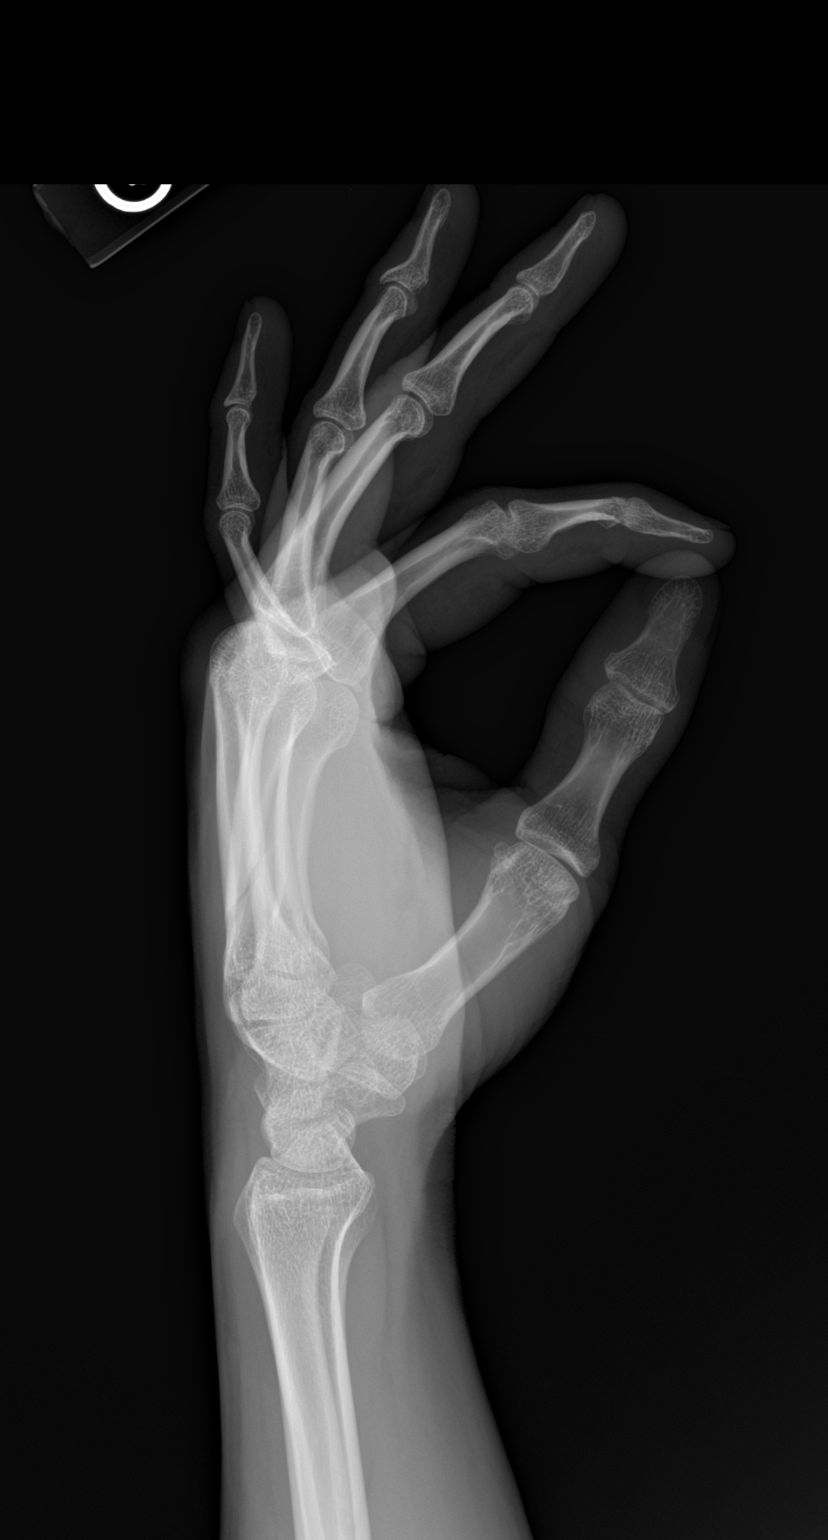

[3 of 3 positions shown; findings below may reference images not displayed]

FINDINGS: Minimally displaced oblique fracture of the fourth metacarpal.
Moderate soft tissue swelling. No dislocation. No other fracture.
IMPRESSION: Minimally displaced oblique fracture of the left fourth metacarpal.

## 2020-08-08 ENCOUNTER — Ambulatory Visit
Admission: EM | Admit: 2020-08-08 | Discharge: 2020-08-08 | Disposition: A | Discharge status: Home / Self Care | Payer: BC Managed Care – PPO | Attending: Physician Assistant | Admitting: Physician Assistant

## 2020-08-08 ENCOUNTER — Other Ambulatory Visit: Payer: Self-pay

## 2020-08-08 DIAGNOSIS — T23461A Corrosion of unspecified degree of back of right hand, initial encounter: Secondary | ICD-10-CM

## 2020-08-08 MED ORDER — HYDROCODONE-ACETAMINOPHEN 5-325 MG PO TABS: 2.0000 | ORAL_TABLET | Freq: Four times a day (QID) | ORAL | 0 refills | Status: DC | PRN

## 2020-08-08 NOTE — ED Triage Notes (Signed)
Patient presented with a chemical burn to his left hand caused by paint thinner. Pt advised he was wearing gloves while using the chemical but it soaked thru. Pt is aox4 and ambulatory.

## 2020-08-08 NOTE — Discharge Instructions (Addendum)
Call Dr. Kittie Plater office to be seen for evaluation.

## 2020-08-08 NOTE — ED Provider Notes (Signed)
[facility]    CSN: [id number] Arrival date & time: 08/08/20  1709      History   Chief Complaint No chief complaint on file.   HPI Troy Howell is a 25 y.o. male.   The history is provided by the patient. No language interpreter was used.  Burn Burn location:  Hand Hand burn location:  R hand Burn quality:  Waxy Progression:  Unable to specify Mechanism of burn:  Chemical Incident location:  Work Worsened by:  Nothing Ineffective treatments:  None tried Tetanus status:  Unknown Pt reports she got paint stripper on his hand.  Pt reports stripper went through his glove.  Pt washed and soaked in episom salt.    Past Medical History:  Diagnosis Date  . Asthma    sports-induced; prn inhaler  . Fracture of metacarpal 05/2012   right small finger  . Heart murmur    states is very small, has never had any problems    There are no problems to display for this patient.   Past Surgical History:  Procedure Laterality Date  . OPEN REDUCTION INTERNAL FIXATION (ORIF) METACARPAL  06/06/2012   Procedure: OPEN REDUCTION INTERNAL FIXATION (ORIF) METACARPAL;  Surgeon: Nicki Reaper, MD;  Location: [facility];  Service: Orthopedics;  Laterality: Right;  open reduction internal fixation right 5th finger   . OPEN REDUCTION INTERNAL FIXATION (ORIF) METACARPAL Left 11/20/2018   Procedure: OPEN REDUCTION INTERNAL FIXATION (ORIF)LEFT RING  METACARPAL;  Surgeon: Cindee Salt, MD;  Location:  SURGERY CENTER;  Service: Orthopedics;  Laterality: Left;  AUXILLARY  . WISDOM TOOTH EXTRACTION         Home Medications    Prior to Admission medications   Medication Sig Start Date End Date Taking? Authorizing Provider  HYDROcodone-acetaminophen (NORCO/VICODIN) 5-325 MG tablet Take 2 tablets by mouth every 6 (six) hours as needed for moderate pain. 08/08/20 08/08/21 Yes Elson Areas, PA-C  acetaminophen (TYLENOL) 500 MG tablet Take 500 mg by mouth every 6  (six) hours as needed.    [provider]  albuterol (PROVENTIL HFA;VENTOLIN HFA) 108 (90 BASE) MCG/ACT inhaler Inhale 2 puffs into the lungs every 6 (six) hours as needed.    [provider]  ibuprofen (ADVIL) 200 MG tablet Take 200 mg by mouth every 6 (six) hours as needed.    [provider]    Family History Family History  Problem Relation Age of Onset  . Healthy Mother     Social History Social History   Tobacco Use  . Smoking status: Never Smoker  . Smokeless tobacco: Never Used  Substance Use Topics  . Alcohol use: No  . Drug use: No     Allergies   Omeprazole   Review of Systems Review of Systems  Skin: Positive for color change and wound.  All other systems reviewed and are negative.    Physical Exam Triage Vital Signs ED Triage Vitals [08/08/20 1723]  Enc Vitals Group     BP (!) 151/79     Pulse Rate 68     Resp 18     Temp 98.2 F (36.8 C)     Temp Source Oral     SpO2 100 %     Weight      Height      Head Circumference      Peak Flow      Pain Score      Pain Loc  Pain Edu?      Excl. in GC?    No data found.  Updated Vital Signs BP (!) 151/79 (BP Location: Left Arm)   Pulse 68   Temp 98.2 F (36.8 C) (Oral)   Resp 18   SpO2 100%   Visual Acuity Right Eye Distance:   Left Eye Distance:   Bilateral Distance:    Right Eye Near:   Left Eye Near:    Bilateral Near:     Physical Exam Vitals reviewed.  Musculoskeletal:        General: Swelling and tenderness present.     Comments: Full hand waxy appearing.  From nv and ns intact   Skin:    General: Skin is warm.  Neurological:     General: No focal deficit present.     Mental Status: He is alert.  Psychiatric:        Mood and Affect: Mood normal.      UC Treatments / Results  Labs (all labs ordered are listed, but only abnormal results are displayed) Labs Reviewed - No data to display  EKG   Radiology No results  found.  Procedures Procedures (including critical care time)  Medications Ordered in UC Medications - No data to display  Initial Impression / Assessment and Plan / UC Course  I have reviewed the triage vital signs and the nursing notes.  Pertinent labs & imaging results that were available during my care of the patient were reviewed by me and considered in my medical decision making (see chart for details).     Pt washed x 20 minutes.  Bacitracin dressing.  Pt referred to Dr. Ulice Bold.  Pt advised to recheck here in 2 days if not seen by plastics by then.  Final Clinical Impressions(s) / UC Diagnoses   Final diagnoses:  None     Discharge Instructions     Call Dr. Kittie Plater office to be seen for evaluation.     ED Prescriptions    Medication Sig Dispense Auth. Provider   HYDROcodone-acetaminophen (NORCO/VICODIN) 5-325 MG tablet Take 2 tablets by mouth every 6 (six) hours as needed for moderate pain. 30 tablet Elson Areas, [place]     I have reviewed the PDMP during this encounter.  An After Visit Summary was printed and given to the patient.    Elson Areas, [place] 08/08/20 1752

## 2020-08-10 ENCOUNTER — Encounter: Payer: Self-pay | Admitting: Surgical

## 2020-08-10 ENCOUNTER — Ambulatory Visit (INDEPENDENT_AMBULATORY_CARE_PROVIDER_SITE_OTHER): Payer: BC Managed Care – PPO | Admitting: Surgical

## 2020-08-10 ENCOUNTER — Other Ambulatory Visit: Payer: Self-pay

## 2020-08-10 VITALS — BP 120/75 | HR 74 | Ht 68.0 in | Wt 159.6 lb

## 2020-08-10 DIAGNOSIS — T23409A Corrosion of unspecified degree of unspecified hand, unspecified site, initial encounter: Secondary | ICD-10-CM

## 2020-08-10 NOTE — Progress Notes (Signed)
Referring Provider Deatra James, MD [phone] [place],  [place] [address]   CC:  Chief Complaint  Patient presents with  . Advice Only      ALLAH REASON is an 25 y.o. male.  HPI: Patient is a 25 year old male here for evaluation of a burn to his right hand.  He reports that he was using paint stripper on a gate at his home on 08/08/2020.  He reports that he was wearing gloves at the time.  He reports that he noticed after about an hour he had some burning sensation in his hand and noticed a change in the appearance of his hand.  He subsequently presented to the urgent care and his hand was soaked.  He denies any fevers, chills, nausea, vomiting, chest pain, shortness of breath.  He reports that they wrapped his hand at the urgent care and it has been dressed since.   Allergies  Allergen Reactions  . Omeprazole Hives    Outpatient Encounter Medications as of 08/10/2020  Medication Sig  . acetaminophen (TYLENOL) 500 MG tablet Take 500 mg by mouth every 6 (six) hours as needed.  Marland Kitchen albuterol (PROVENTIL HFA;VENTOLIN HFA) 108 (90 BASE) MCG/ACT inhaler Inhale 2 puffs into the lungs every 6 (six) hours as needed.  Marland Kitchen HYDROcodone-acetaminophen (NORCO/VICODIN) 5-325 MG tablet Take 2 tablets by mouth every 6 (six) hours as needed for moderate pain.  Marland Kitchen ibuprofen (ADVIL) 200 MG tablet Take 200 mg by mouth every 6 (six) hours as needed.   No facility-administered encounter medications on file as of 08/10/2020.     Past Medical History:  Diagnosis Date  . Asthma    sports-induced; prn inhaler  . Fracture of metacarpal 05/2012   right small finger  . Heart murmur    states is very small, has never had any problems    Past Surgical History:  Procedure Laterality Date  . OPEN REDUCTION INTERNAL FIXATION (ORIF) METACARPAL  06/06/2012   Procedure: OPEN REDUCTION INTERNAL FIXATION (ORIF) METACARPAL;  Surgeon: Nicki Reaper, MD;  Location: [facility];  Service:  Orthopedics;  Laterality: Right;  open reduction internal fixation right 5th finger   . OPEN REDUCTION INTERNAL FIXATION (ORIF) METACARPAL Left 11/20/2018   Procedure: OPEN REDUCTION INTERNAL FIXATION (ORIF)LEFT RING  METACARPAL;  Surgeon: Cindee Salt, MD;  Location: [facility];  Service: Orthopedics;  Laterality: Left;  AUXILLARY  . WISDOM TOOTH EXTRACTION      Family History  Problem Relation Age of Onset  . Healthy Mother     Social History   Social History Narrative  . Not on file     Review of Systems General: Denies fevers, chills, weight loss CV: Denies chest pain, shortness of breath, palpitations Skin: Positive for wound   Physical Exam Vitals with BMI 08/10/2020 08/08/2020 11/20/2018  Height 5\' 8"  - -  Weight 159 lbs 10 oz - -  BMI 24.27 - -  Systolic 120 151  Diastolic 75 79 80  Pulse 74 68 69    General:  No acute distress,  Alert and oriented, Non-Toxic, Normal speech and affect Right hand: Right hand with waxy appearing digits and palmar surface.  There is no blistering or sloughing of the epithelium noted.  There is no foul odors or erythema noted.  He has a 2+ radial pulse.  Good sensation distally.  Capillary refill is normal.  Normal temperature of distal extremity. Compartments are soft.  Assessment/Plan 25 year old male  with a chemical burn to his right hand after using paint stripper at home 2 days ago.  His hand is been wrapped with bacitracin for the past 2 days.   Approximately 1% body burn.  Recommend continued pain control with hydrocodone, can also use Tylenol and ibuprofen.  Recommend keeping the hand completely covered with Vaseline to help moisturize, cover with nonstick pads, Kerlix, Coban.  We will order the patient supplies.  I discussed with the patient that I would like him to follow-up in 1 week for reevaluation.  I discussed the expected healing process with the patient.  I recommended he call us with any questions or  concerns or if her symptoms worsen.  We are going to order him supplies from [facility] for wound care.   Pictures were obtained of the patient and placed in the chart with the patient's or guardian's permission.  I do not see any sign of infection.     Kermit Balo Dhruvan Gullion 08/10/2020, 2:59 PM

## 2020-08-12 ENCOUNTER — Telehealth: Payer: Self-pay | Admitting: *Deleted

## 2020-08-12 NOTE — Telephone Encounter (Addendum)
Faxed order to [facility] on (08/11/2020) for medical supplies for the patient.  Confirmation received and copy scanned into the chart.    Supplies:Kerlix                Medipore Tape                Vaseline                Non-Stick Pad/Gauze   Received on (08/15/2020)-Order Status Notfication:[facility] has contacted the patient with pricing options.  Their deductible has not yet been satisfied.  The patient purchased the requested supplies.//AB/CMA

## 2020-08-18 ENCOUNTER — Other Ambulatory Visit: Payer: Self-pay

## 2020-08-18 ENCOUNTER — Telehealth: Payer: Self-pay

## 2020-08-18 ENCOUNTER — Ambulatory Visit (INDEPENDENT_AMBULATORY_CARE_PROVIDER_SITE_OTHER): Payer: BC Managed Care – PPO | Admitting: Surgical

## 2020-08-18 ENCOUNTER — Encounter: Payer: Self-pay | Admitting: Surgical

## 2020-08-18 VITALS — BP 115/73 | HR 95

## 2020-08-18 DIAGNOSIS — T23409A Corrosion of unspecified degree of unspecified hand, unspecified site, initial encounter: Secondary | ICD-10-CM

## 2020-08-18 MED ORDER — OXYCODONE-ACETAMINOPHEN 5-325 MG PO TABS: 1.0000 | ORAL_TABLET | Freq: Four times a day (QID) | ORAL | 0 refills | Status: AC | PRN

## 2020-08-18 NOTE — Addendum Note (Signed)
Addended byKeenan Bachelor on: 08/18/2020 04:47 PM   Modules accepted: Orders

## 2020-08-18 NOTE — Telephone Encounter (Signed)
Patient called to check on his prescription, the pharmacy doesn't have it yet.  Please call.  **Patient's preferred pharmacy is [facility] at the [address] of [address]**

## 2020-08-18 NOTE — Progress Notes (Addendum)
   Referring Provider Deatra James, MD [phone] [place],  [place] [address]   CC:  Chief Complaint  Patient presents with  . Follow-up      Troy Howell is an 25 y.o. male.  HPI: Patient is a 25 year old male here for follow-up on a chemical burn to his right hand.  He sustained this burn while using paint stripper on 08/08/2020.  He reports that he is overall doing okay, reports he has been doing dressing changes at home with Vaseline, nonstick gauze, Kerlix and Ace wraps.  He is also been using a nitrile glove at night over top of the hand coated and Vaseline to allow increased range of motion.  He reports that he feels as if his range of motion has significantly improved.  He is not having any infectious symptoms.  He does report that he is still having significant pain and has been taking Tylenol, but reports this is having minimal improvement in his symptoms.  Review of Systems General: No fevers, chills, nausea, vomiting.  Physical Exam Vitals with BMI 08/18/2020 08/10/2020 08/08/2020  Height - 5\' 8"  -  Weight - 159 lbs 10 oz -  BMI - 24.27 -  Systolic 115 120  Diastolic 73 75 79  Pulse 95 74 68    General:  No acute distress,  Alert and oriented, Non-Toxic, Normal speech and affect Right hand: Right hand with improvement in the waxy appearance of his hand, there has been some sloughing of the epithelium, but there is no skin necrosis or skin loss noted today on exam.  He has a 2+ radial pulse.  Sensation intact distally with some abnormal sensations noted.  Capillary refill is normal.  Normal temperature of distal extremity.  Compartments are soft.     Assessment/Plan Recommend continuing with daily dressing changes with Vaseline.  Recommend covering with nonstick pads, Kerlix and Coban or Ace wrap.  At night he can cover the area completely with Vaseline and wear a nitrile glove if this is more comfortable for him.  I recommend he call [phone] with any questions  or concerns or if symptoms change or worsen.   Prescription sent to pharmacy for severe pain, recommend tylenol for moderate pain.  I recommend he follow-up in 1 week for reevaluation.  Pictures were obtained of the patient and placed in the chart with the patient's permission.  No sign of infection.  [place] Scheeler 08/18/2020, 2:10 PM

## 2020-08-22 NOTE — Telephone Encounter (Signed)
Prescription was sent to the pharmacy [facility] on (08/18/20).//AB/CMA

## 2020-08-24 ENCOUNTER — Encounter: Payer: Self-pay | Admitting: Surgical

## 2020-08-24 ENCOUNTER — Other Ambulatory Visit: Payer: Self-pay

## 2020-08-24 ENCOUNTER — Ambulatory Visit (INDEPENDENT_AMBULATORY_CARE_PROVIDER_SITE_OTHER): Payer: BC Managed Care – PPO | Admitting: Surgical

## 2020-08-24 VITALS — BP 108/70 | HR 77

## 2020-08-24 DIAGNOSIS — T23409D Corrosion of unspecified degree of unspecified hand, unspecified site, subsequent encounter: Secondary | ICD-10-CM

## 2020-08-24 NOTE — Progress Notes (Signed)
Patient is a 25 year old male here for follow-up on a chemical burn to his right hand.  He sustained this burn about 2 weeks ago.  He reports pain has been a lot better over the past few days.  He is not having any pain at all anymore.  He reports he has been more active with this hand and feels as if things are overall going well.  He reports that he has been keeping it covered with Vaseline and a nitrile glove and reports that he feels as if his function is returning to normal.  He reports some decreased strength with gripping due to some tenderness.  He is not having any infectious symptoms.  Physical exam: General: No acute distress, alert and oriented, nontoxic, normal speech and affect. Right hand: Right hand with 2+ radial pulse, slight improvement in the waxy appearance of his hand.  No additional sloughing of the epithelium has been noted.  There is a small blister on the thenar eminence.  Sensation intact distally.  He has good range of motion of all 5 digits.  Normal capillary refill.  Normal temperature of distal extremity.  Soft compartments.  I recommend he continue to keep the hand moisturized, can begin using less Vaseline and can begin using a thin layer of Vaseline, I suspect that in the next few weeks this will go on to return to normal.  I discussed with the patient that overall everything appears to be doing well, I do not see any sign of infection.  I recommend he call with any questions or concerns, follow-up in 2 weeks for reevaluation.

## 2020-09-07 ENCOUNTER — Ambulatory Visit (INDEPENDENT_AMBULATORY_CARE_PROVIDER_SITE_OTHER): Payer: BC Managed Care – PPO | Admitting: Surgical

## 2020-09-07 ENCOUNTER — Encounter: Payer: Self-pay | Admitting: Surgical

## 2020-09-07 ENCOUNTER — Other Ambulatory Visit: Payer: Self-pay

## 2020-09-07 VITALS — BP 126/77 | HR 84

## 2020-09-07 DIAGNOSIS — T23409D Corrosion of unspecified degree of unspecified hand, unspecified site, subsequent encounter: Secondary | ICD-10-CM | POA: Diagnosis not present

## 2020-09-07 NOTE — Progress Notes (Signed)
   Referring Provider Deatra James, MD [phone] [place],  [place] [address]   CC:  Chief Complaint  Patient presents with  . Follow-up      Troy Howell is an 25 y.o. male.  HPI: 25 year old male here for follow-up on his right hand chemical burn.  He sustained this approximately 1 month ago.  He reports he is overall doing well, reports he has started full-time at work again.  He works at [employer].  He reports that he is still having difficulty with grip strength, he otherwise feels as if things are going well.  He reports that within the last week he has noticed a lot of skin peeling off of his hand.  He reports he is not having any infectious symptoms.  He reports he has been doing Vaseline to the hand daily and covering this with a nitrile glove.  Review of Systems General: No fevers, chills, nausea, vomiting  Physical Exam Vitals with BMI 09/07/2020 08/24/2020 08/18/2020  Height - - -  Weight - - -  BMI - - -  Systolic 126 108 638  Diastolic 77 70 73  Pulse 84 77 95    General:  No acute distress,  Alert and oriented, Non-Toxic, Normal speech and affect Right hand: 2+ radial pulse noted.  Good capillary refill noted of all digits.  Some epithelium noted to be peeling at the edges of his fingers.  New epithelium noted throughout his hand.  No blisters noted.  No erythema noted.  Some tenderness with palpation noted.  Waxy appearance has improved.  Assessment/Plan Recommend continuing with Vaseline daily to his right hand, I discussed with him that he would likely continue to have some peeling.  I discussed with him that overall everything appears to be doing well, his range of motion has significantly improved, he is still having some difficulty with grip strength, but this is secondary to pain.  He has no signs of infection.   I did discuss with him that due to the nature of his job I recommend he remain on light duty at work, I discussed with him that he would  be at an increased risk of infection if he handled any raw meats with the burn hand as he still has some areas that may be open.  Patient is understanding of this, however reports that he does not think that this is possible.  I did offer to write him a letter to provide to his employer, however he declined at this time.  I recommend he follow-up with me in 2 to 3 weeks for reevaluation.  Pictures were obtained of the patient and placed in the chart with the patient's or guardian's permission.   Kermit Balo Sherie Dobrowolski 09/07/2020, 2:07 PM

## 2020-09-29 ENCOUNTER — Other Ambulatory Visit: Payer: Self-pay

## 2020-09-29 ENCOUNTER — Encounter: Payer: Self-pay | Admitting: Surgical

## 2020-09-29 ENCOUNTER — Ambulatory Visit (INDEPENDENT_AMBULATORY_CARE_PROVIDER_SITE_OTHER): Payer: BC Managed Care – PPO | Admitting: Surgical

## 2020-09-29 VITALS — BP 117/80 | HR 77

## 2020-09-29 DIAGNOSIS — T23409D Corrosion of unspecified degree of unspecified hand, unspecified site, subsequent encounter: Secondary | ICD-10-CM

## 2020-09-29 NOTE — Progress Notes (Signed)
   Referring Provider Deatra James, MD [phone] [address] [place],  [place] [address]   CC:  Chief Complaint  Patient presents with  . Follow-up      Troy Howell is an 25 y.o. male.  HPI: Patient is a 25 year old male here for follow-up on his right hand burn.  He sustained this burn on 08/08/2020 after using paint stripper.  Patient is doing well, he has continued to use Vaseline.  He reports that he has noticed a lot of his skin has been peeling over the past few weeks.  He is not having any infectious symptoms.  He reports normal function of his hand.  He has no open wounds.  Review of Systems General: No fevers, chills  Physical Exam Vitals with BMI 09/29/2020 09/07/2020 08/24/2020  Height - - -  Weight - - -  BMI - - -  Systolic 117 126 626  Diastolic 80 77 70  Pulse 77 84 77    General:  No acute distress,  Alert and oriented, Non-Toxic, Normal speech and affect Extremities: Normal left hand with tattoo on dorsum.  Right hand healing nicely from his chemical burn, some peeling skin still noted, however has a good base of new epithelium noted.  No erythema.  2+ radial pulse noted.  Normal sensation intact.  No swelling is noted.  Normal capillary refill.  Assessment/Plan Discussed with patient that overall everything appears to be doing well, discussed with him that he may continue to notice some skin peeling but over time this should continue to resolve.  I do not see any more waxy skin or signs of burn.  I recommend he use a moisturizing lotion or Aquaphor to keep the hand moisturized.  I recommend he follow-up on an as-needed basis.   Recommend calling with questions or concerns. Picture was taken and placed in the patient's chart with his permission  Leslee Home 09/29/2020, 2:42 PM
